# Patient Record
Sex: Female | Born: 1975 | Race: Black or African American | Hispanic: No | Marital: Single | Smoking: Former smoker
Health system: Southern US, Community
[De-identification: ages and names within clinical notes are randomized; demographics above are authoritative.]

---

## 1999-02-12 ENCOUNTER — Other Ambulatory Visit: Admission: RE | Admit: 1999-02-12 | Discharge: 1999-02-12 | Payer: Self-pay | Admitting: Obstetrics and Gynecology

## 1999-09-14 ENCOUNTER — Inpatient Hospital Stay (HOSPITAL_COMMUNITY): Admission: AD | Admit: 1999-09-14 | Discharge: 1999-09-21 | Payer: Self-pay | Admitting: Obstetrics and Gynecology

## 1999-10-15 ENCOUNTER — Other Ambulatory Visit: Admission: RE | Admit: 1999-10-15 | Discharge: 1999-10-15 | Payer: Self-pay | Admitting: Obstetrics and Gynecology

## 2001-02-02 ENCOUNTER — Other Ambulatory Visit: Admission: RE | Admit: 2001-02-02 | Discharge: 2001-02-02 | Payer: Self-pay | Admitting: Obstetrics and Gynecology

## 2002-03-02 ENCOUNTER — Other Ambulatory Visit: Admission: RE | Admit: 2002-03-02 | Discharge: 2002-03-02 | Payer: Self-pay | Admitting: Obstetrics and Gynecology

## 2002-10-04 ENCOUNTER — Other Ambulatory Visit: Admission: RE | Admit: 2002-10-04 | Discharge: 2002-10-04 | Payer: Self-pay | Admitting: Obstetrics and Gynecology

## 2003-05-11 ENCOUNTER — Other Ambulatory Visit: Admission: RE | Admit: 2003-05-11 | Discharge: 2003-05-11 | Payer: Self-pay | Admitting: Obstetrics and Gynecology

## 2003-05-14 ENCOUNTER — Other Ambulatory Visit: Admission: RE | Admit: 2003-05-14 | Discharge: 2003-05-14 | Payer: Self-pay | Admitting: Obstetrics and Gynecology

## 2004-05-22 ENCOUNTER — Other Ambulatory Visit: Admission: RE | Admit: 2004-05-22 | Discharge: 2004-05-22 | Payer: Self-pay | Admitting: Obstetrics and Gynecology

## 2005-05-28 ENCOUNTER — Ambulatory Visit: Payer: Self-pay | Admitting: Professional

## 2005-06-11 ENCOUNTER — Ambulatory Visit: Payer: Self-pay | Admitting: Professional

## 2005-06-18 ENCOUNTER — Ambulatory Visit: Payer: Self-pay | Admitting: Professional

## 2013-01-26 ENCOUNTER — Other Ambulatory Visit: Payer: Self-pay | Admitting: Family Medicine

## 2013-01-26 DIAGNOSIS — G43909 Migraine, unspecified, not intractable, without status migrainosus: Secondary | ICD-10-CM

## 2013-01-30 ENCOUNTER — Other Ambulatory Visit: Payer: Self-pay

## 2013-02-06 ENCOUNTER — Other Ambulatory Visit: Payer: Self-pay

## 2014-04-02 ENCOUNTER — Encounter: Payer: Self-pay | Admitting: Podiatry

## 2014-04-02 ENCOUNTER — Ambulatory Visit (INDEPENDENT_AMBULATORY_CARE_PROVIDER_SITE_OTHER): Payer: BC Managed Care – PPO

## 2014-04-02 ENCOUNTER — Ambulatory Visit (INDEPENDENT_AMBULATORY_CARE_PROVIDER_SITE_OTHER): Payer: BC Managed Care – PPO | Admitting: Podiatry

## 2014-04-02 VITALS — BP 142/85 | HR 69 | Resp 15 | Ht 65.0 in | Wt 195.0 lb

## 2014-04-02 DIAGNOSIS — M7751 Other enthesopathy of right foot: Secondary | ICD-10-CM

## 2014-04-02 DIAGNOSIS — M898X9 Other specified disorders of bone, unspecified site: Secondary | ICD-10-CM

## 2014-04-02 DIAGNOSIS — M2020 Hallux rigidus, unspecified foot: Secondary | ICD-10-CM

## 2014-04-02 NOTE — Progress Notes (Signed)
   Subjective:    Patient ID: Mckenzie FellingKimberly E Koslow, female    DOB: 03/11/1976, 38 y.o.   MRN: [id number]  HPI Comments: N bone spur L B/L 1st MPJ D and O 2 months  C 1st MPJs are enlarged, and painful A enclosed shoes T none  Foot Pain   This patient is complaining of pain in the right great toe joint more than the left when she attempts to wear a closed shoe, even in athletic style shoe. The pain is localized more on the dorsal aspect of the first metatarsal area. She wears soft or open shoes as much is possible. She would like to be more physically active in the foot pain is preventing her from increasing her physical activity.   Review of Systems  All other systems reviewed and are negative.      Objective:   Physical Exam Orientated [phone] 38 year old black female  Vascular DP and PT pulses 2/4 bilaterally.  Neurological: Ankle reflexes reactive bilaterally  Dermatological: There is a dorsal prominence with low-grade edema localized over the head of the first metatarsal areas, right more than left.  Musculoskeletal: There is restriction in range of motion of the first metatarsal phalangeal joint with the foot in a neutral in weightbearing position bilaterally. Dorsi flexion is approximately 20 bilaterally. There is palpable tenderness in the dorsal first metatarsal head, bilaterally.  The first rays are hypermobile, bilaterally  X-ray report weightbearing right foot foot   Intact bony structure without fracture and/or dislocation noted. The first metatarsal is a relatively long as compared to the second left metatarsal. There is a small dorsal spur on the head of the first metatarsal. The first ray is elevated.  Radiographic impression:  No acute bony abnormality noted  Long first metatarsal with metatarsus primus elavatus and beginning OsteoArthritic changes  X-ray report weightbearing left foot   Intact bony structure without fracture and/or dislocation noted. The first  metatarsal is a relatively long as compared to the second left metatarsal. There is a small dorsal spur on the head of the first metatarsal. The first ray is elevated.  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      Assessment & Plan:   Assessment: Hallux limitus/rigidus bilaterally  Hypermobile first rays, bilaterally  Plan: Had a detailed discussion was patient about treatment options, including shoe modification, foot supports and reduction of activity. In general I made patient aware that her condition tended to be progressive in over a multiple year period of time, the first metatarsophalangeal joint would become progressively more more painful as a result of osteoarthritis. I made her aware that surgical options were available. Patient has interest in possible surgical options.  Reschedule patient for further evaluation with Dr. Arbutus Pedodd Hyatt.

## 2014-04-02 NOTE — Patient Instructions (Signed)
Hallux Rigidus Hallux rigidus is a condition involving pain and a loss of motion of the first (big) toe. The pain gets worse with lifting up (extension) of the toe. This is usually due to arthritic bony bumps (spurring) of the joint at the base of the big toe.  SYMPTOMS   Pain, with lifting up of the toe.  Tenderness over the joint where the big toe meets the foot.  Redness, swelling, and warmth over the top of the base of the big toe (sometimes).  Foot pain, stiffness, and limping. CAUSES  Halllux rigidus is caused by arthritis of the joint where the big toe meets the foot. The arthritis creates a bone spur that pinches the soft tissues, when the toe is extended. RISK INCREASES WITH:  Tight shoes, with a narrow toe box.  Family history of foot problems.  Gout and rheumatoid and psoriatic arthritis.  History of previous toe injury, including "turf toe."  Long first toe, flat feet, and other big toe bony bumps.  Arthritis of the big toe. PREVENTION   Wear wide toed shoes that fit well.  Tape the big toe, to reduce motion and to prevent pinching of the tissues between the bone.  Maintain physical fitness:  Foot and ankle flexibility.  Muscle strength and endurance. PROGNOSIS  This condition can usually be managed with proper treatment. However, surgery is typically required to prevent the problem from recurring.  RELATED COMPLICATIONS  Injury to other areas of the foot or ankle, caused by abnormal walking in an attempt to avoid the pain felt when walking normally. TREATMENT Treatment first involves stopping the activities that aggravate your symptoms. Ice and medicine can be used to reduce the pain and inflammation. Modifications to shoes may help reduce pain, including wearing stiff-soled shoes, shoes with a wide toe box, inserting a padded donut to relieve pressure on top of the joint, or wearing an arch support. Corticosteroid injections may be given to reduce inflammation.  If non-surgical treatment is unsuccessful, surgery may be needed. Surgical options include removing the arthritic bony spur, cutting a bone in the foot to change the arc of motion (allowing the toe to extend more), or fusion of the joint (eliminating all motion in the joint at the base of the big toe).  MEDICATION   If pain medicine is needed, nonsteroidal anti-inflammatory medicines (aspirin and ibuprofen), or other minor pain relievers (acetaminophen), are often advised.  Do not take pain medicine for 7 days before surgery.  Prescription pain relievers are usually prescribed only after surgery. Use only as directed and only as much as you need.  Ointments for arthritis, applied to the skin, may give some relief.  Injections of corticosteroids may be given to reduce inflammation. HEAT AND COLD  Cold treatment (icing) relieves pain and reduces inflammation. Cold treatment should be applied for 10 to 15 minutes every 2 to 3 hours, and immediately after activity that aggravates your symptoms. Use ice packs or an ice massage.  Heat treatment may be used before performing the stretching and strengthening activities prescribed by your caregiver, physical therapist, or athletic trainer. Use a heat pack or a warm water soak. SEEK MEDICAL CARE IF:   Symptoms get worse or do not improve in 2 weeks, despite treatment.  After surgery you develop fever, increasing pain, redness, swelling, drainage of fluids, bleeding, or increasing warmth.  New, unexplained symptoms develop. (Drugs used in treatment may produce side effects.) Document Released: 12/07/2005 Document Revised: 02/29/2012 Document Reviewed: 03/21/2009 [employer] Patient   Information 2014 [employer].  

## 2014-04-03 ENCOUNTER — Encounter: Payer: Self-pay | Admitting: Podiatry

## 2014-05-01 ENCOUNTER — Encounter: Payer: Self-pay | Admitting: Podiatry

## 2014-05-01 ENCOUNTER — Ambulatory Visit (INDEPENDENT_AMBULATORY_CARE_PROVIDER_SITE_OTHER): Payer: BC Managed Care – PPO | Admitting: Podiatry

## 2014-05-01 ENCOUNTER — Ambulatory Visit: Payer: BC Managed Care – PPO | Admitting: Podiatry

## 2014-05-01 VITALS — BP 132/81 | HR 80 | Resp 12

## 2014-05-01 DIAGNOSIS — M2020 Hallux rigidus, unspecified foot: Secondary | ICD-10-CM

## 2014-05-01 NOTE — Progress Notes (Signed)
Mckenzie BradfordKimberly presents today after being referred to me by Dr. Leeanne Deeduchman for hallux limitus right greater than left. She would like to have surgical intervention but is questioning going to have it.  Objective: Vital signs are stable she is alert and oriented x3. I reviewed her past medical history medications allergies surgeries social history and notes from Dr. Leeanne Deeduchman as well as her radiographs. Pulses remain palpable bilateral. She has limited range of motion of approximately 20 of dorsiflexion the first metatarsophalangeal joint of the right foot and similarly left. 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 She does not have gastroc equinus.  Assessment: Elongated first metatarsal elevated first metatarsal right foot dorsal spurring joint space narrowing; hallux limitus right greater than left.  Plan: Discussed etiology pathology conservative versus surgical therapies. We did discuss the she does chevron osteotomy and a double chevron osteotomy and junction with a cotton procedure. She would like to have the better correction and understands this and take 8 weeks of cast nonweightbearing fashion and she's not ready for this at this time. She would like to wait until December before this is done. She will followup with me in November to discuss this once again.

## 2014-08-24 ENCOUNTER — Other Ambulatory Visit (HOSPITAL_COMMUNITY)
Admission: RE | Admit: 2014-08-24 | Discharge: 2014-08-24 | Disposition: A | Discharge status: Home / Self Care | Payer: BC Managed Care – PPO | Source: Ambulatory Visit | Attending: Obstetrics & Gynecology | Admitting: Obstetrics & Gynecology

## 2014-08-24 ENCOUNTER — Other Ambulatory Visit: Payer: Self-pay | Admitting: Obstetrics & Gynecology

## 2014-08-24 DIAGNOSIS — Z01419 Encounter for gynecological examination (general) (routine) without abnormal findings: Secondary | ICD-10-CM | POA: Diagnosis present

## 2014-08-24 DIAGNOSIS — Z1151 Encounter for screening for human papillomavirus (HPV): Secondary | ICD-10-CM | POA: Diagnosis present

## 2014-08-29 LAB — CYTOLOGY - PAP

## 2014-10-24 ENCOUNTER — Other Ambulatory Visit: Payer: Self-pay | Admitting: Family Medicine

## 2014-10-24 DIAGNOSIS — E049 Nontoxic goiter, unspecified: Secondary | ICD-10-CM

## 2014-11-06 ENCOUNTER — Other Ambulatory Visit: Payer: BC Managed Care – PPO

## 2014-11-06 ENCOUNTER — Ambulatory Visit: Payer: BC Managed Care – PPO | Admitting: Podiatry

## 2014-11-12 ENCOUNTER — Other Ambulatory Visit: Payer: BC Managed Care – PPO

## 2014-11-20 ENCOUNTER — Ambulatory Visit (INDEPENDENT_AMBULATORY_CARE_PROVIDER_SITE_OTHER): Payer: BC Managed Care – PPO | Admitting: Podiatry

## 2014-11-20 VITALS — BP 139/87 | HR 66 | Resp 16

## 2014-11-20 DIAGNOSIS — M2021 Hallux rigidus, right foot: Secondary | ICD-10-CM

## 2014-11-20 NOTE — Progress Notes (Signed)
She presents today for surgical consult regarding her right foot. She states that it is beginning to affect her ability to perform her daily activities and to perform physical exercise to keep her health in check.  Objective: Vital signs are stable she is alert and oriented 3. Pulses are intact. Elevated first metatarsal with hallux limitus first metatarsophalangeal joint. Radiographs were reviewed and confirmed today.  Assessment: Has planus with an elevated first metatarsal resulting in hallux limitus first metatarsophalangeal joint right foot.  Plan: Discussed etiology and pathology conservative versus surgical therapies. We will perform a cotton osteotomy with an Eliberto IvoryAustin Elswick osteotomy and screw. I answered all the questions regarding this procedure to the best of my ability in layman's terms. She understood this and was amenable to it. She also understands that she will receive a below-knee cast. We did discuss the possible postop complications which may include are not limited to postop pain bleeding swelling infection recurrence need further surgery. I will follow-up with her the near future for surgical correction.

## 2014-11-20 NOTE — Patient Instructions (Signed)
Pre-Operative Instructions  Congratulations, you have decided to take an important step to improving your quality of life.  You can be assured that the doctors of [facility] will be with you every step of the way.  1. Plan to be at the surgery center/hospital at least 1 (one) hour prior to your scheduled time unless otherwise directed by the surgical center/hospital staff.  You must have a responsible adult accompany you, remain during the surgery and drive you home.  Make sure you have directions to the surgical center/hospital and know how to get there on time. 2. For hospital based surgery you will need to obtain a history and physical form from your family physician within 1 month prior to the date of surgery- we will give you a form for you primary physician.  3. We make every effort to accommodate the date you request for surgery.  There are however, times where surgery dates or times have to be moved.  We will contact you as soon as possible if a change in schedule is required.   4. No Aspirin/Ibuprofen for one week before surgery.  If you are on aspirin, any non-steroidal anti-inflammatory medications (Mobic, Aleve, Ibuprofen) you should stop taking it 7 days prior to your surgery.  You make take Tylenol  For pain prior to surgery.  5. Medications- If you are taking daily heart and blood pressure medications, seizure, reflux, allergy, asthma, anxiety, pain or diabetes medications, make sure the surgery center/hospital is aware before the day of surgery so they may notify you which medications to take or avoid the day of surgery. 6. No food or drink after midnight the night before surgery unless directed otherwise by surgical center/hospital staff. 7. No alcoholic beverages 24 hours prior to surgery.  No smoking 24 hours prior to or 24 hours after surgery. 8. Wear loose pants or shorts- loose enough to fit over bandages, boots, and casts. 9. No slip on shoes, sneakers are best. 10. Bring  your boot with you to the surgery center/hospital.  Also bring crutches or a walker if your physician has prescribed it for you.  If you do not have this equipment, it will be provided for you after surgery. 11. If you have not been contracted by the surgery center/hospital by the day before your surgery, call to confirm the date and time of your surgery. 12. Leave-time from work may vary depending on the type of surgery you have.  Appropriate arrangements should be made prior to surgery with your employer. 13. Prescriptions will be provided immediately following surgery by your doctor.  Have these filled as soon as possible after surgery and take the medication as directed. 14. Remove nail polish on the operative foot. 15. Wash the night before surgery.  The night before surgery wash the foot and leg well with the antibacterial soap provided and water paying special attention to beneath the toenails and in between the toes.  Rinse thoroughly with water and dry well with a towel.  Perform this wash unless told not to do so by your physician.  Enclosed: 1 Ice pack (please put in freezer the night before surgery)   1 Hibiclens skin cleaner   Pre-op Instructions  If you have any questions regarding the instructions, do not hesitate to call our office.  [place]: [address] [place], [place] [address] [phone]  [place]: [address] [place], [place] [address] [phone]  [place]: [address]  [place], [place] [address] [phone]  Dr. Richard   Tuchman DPM, Dr. Norman Regal DPM Dr. Richard Sikora DPM, Dr. M. Todd Hyatt DPM, Dr. Kathryn Egerton DPM 

## 2014-11-21 ENCOUNTER — Telehealth: Payer: Self-pay | Admitting: *Deleted

## 2014-11-21 NOTE — Telephone Encounter (Signed)
Pt request copy of the surgical consent form she signed yesterday and had questions about the procedure.  I spoke with pt, she requested the consent be faxed to [phone], then she asked how long she would be in the boot after the cast was removed.  I told her about 4 - 6 weeks and faxed the forms.

## 2014-12-03 ENCOUNTER — Ambulatory Visit
Admission: RE | Admit: 2014-12-03 | Discharge: 2014-12-03 | Disposition: A | Discharge status: Home / Self Care | Payer: BC Managed Care – PPO | Source: Ambulatory Visit | Attending: Family Medicine | Admitting: Family Medicine

## 2014-12-03 DIAGNOSIS — E049 Nontoxic goiter, unspecified: Secondary | ICD-10-CM

## 2014-12-19 ENCOUNTER — Other Ambulatory Visit: Payer: Self-pay | Admitting: Podiatry

## 2014-12-19 ENCOUNTER — Encounter: Payer: Self-pay | Admitting: Podiatry

## 2014-12-19 DIAGNOSIS — M2011 Hallux valgus (acquired), right foot: Secondary | ICD-10-CM

## 2014-12-19 DIAGNOSIS — M2021 Hallux rigidus, right foot: Secondary | ICD-10-CM

## 2014-12-19 MED ORDER — CEPHALEXIN 500 MG PO CAPS: 500.0000 mg | ORAL_CAPSULE | Freq: Three times a day (TID) | ORAL | Status: DC

## 2014-12-19 MED ORDER — PROMETHAZINE HCL 25 MG PO TABS: 25.0000 mg | ORAL_TABLET | Freq: Three times a day (TID) | ORAL | Status: DC | PRN

## 2014-12-19 MED ORDER — OXYCODONE-ACETAMINOPHEN 10-325 MG PO TABS: ORAL_TABLET | ORAL | Status: AC

## 2014-12-24 ENCOUNTER — Telehealth: Payer: Self-pay | Admitting: *Deleted

## 2014-12-24 NOTE — Telephone Encounter (Addendum)
Pt states she has itching only when she takes 2 Percocet she itches and would like to know if she could take Benadryl with.  Dr. Ardelle Anton states take the Benadryl and Phenergan when taking the Percocet, and if problems with swelling or difficulty breathing or swallowing go to the ER.  Pt was instructed the medication could be changed, but opted to continue.

## 2014-12-24 NOTE — Telephone Encounter (Signed)
-----   Message from Darreld Mclean sent at 12/24/2014  4:08 PM EST ----- Regarding: Questions about pain Medication Contact: [phone] Patient is coming in Thursday for her first post-op appointment and has questions about the pain medication. Can someone please call her back today. Thanks.

## 2014-12-24 NOTE — Telephone Encounter (Signed)
I called the patient.  "One of the nurses has called me back.  Thanks for calling me back."

## 2014-12-25 ENCOUNTER — Encounter: Payer: BC Managed Care – PPO | Admitting: Podiatry

## 2014-12-26 NOTE — Progress Notes (Unsigned)
Dr Al CorpusHyatt performed a right Mckenzie Davis bunionectomy and right tarsal osteotomy with cast application

## 2014-12-27 ENCOUNTER — Ambulatory Visit (INDEPENDENT_AMBULATORY_CARE_PROVIDER_SITE_OTHER): Payer: BLUE CROSS/BLUE SHIELD | Admitting: Podiatry

## 2014-12-27 ENCOUNTER — Ambulatory Visit (INDEPENDENT_AMBULATORY_CARE_PROVIDER_SITE_OTHER): Payer: BLUE CROSS/BLUE SHIELD

## 2014-12-27 ENCOUNTER — Encounter: Payer: Self-pay | Admitting: Podiatry

## 2014-12-27 VITALS — BP 131/82 | HR 84 | Resp 13

## 2014-12-27 DIAGNOSIS — M2011 Hallux valgus (acquired), right foot: Secondary | ICD-10-CM

## 2014-12-28 NOTE — Progress Notes (Signed)
Subjective:     Patient ID: Mckenzie Davis, female   DOB: 02/23/1976, 39 y.o.   MRN: [id number]  HPI patient states I have had minimal discomfort I have not ambulated on my foot and have been using crutches and my rollabout and I have had minimal problems with swelling   Review of Systems     Objective:   Physical Exam Neurovascular to the digits is intact with cast intact in the right lower leg    Assessment:     Doing well post Eliberto IvoryAustin and cotton osteotomy right    Plan:     Reviewed x-rays and advised that the patient will be seen back in 2 weeks for bivalving removal of the cast with consideration for either new cast or Cam Walker and to call us immediately if any problems should occur

## 2014-12-31 ENCOUNTER — Telehealth: Payer: Self-pay | Admitting: *Deleted

## 2014-12-31 NOTE — Telephone Encounter (Signed)
"  I called and informed her I think it is normal but I will check with Dr. Al CorpusHyatt.  "I just want to make sure."

## 2014-12-31 NOTE — Telephone Encounter (Signed)
Yes you are correct.  This can be normal and should subside as the swelling decreases.

## 2014-12-31 NOTE — Telephone Encounter (Signed)
"  I just have some questions in reference to the way my toes are feeling after the surgery I had on the 30th.  I have tingly, numb feeling, feel like some pain spasms are going from the heel up to the top of my foot.  I just want to know if this is normal?"

## 2015-01-08 ENCOUNTER — Ambulatory Visit (INDEPENDENT_AMBULATORY_CARE_PROVIDER_SITE_OTHER): Payer: BLUE CROSS/BLUE SHIELD | Admitting: Podiatry

## 2015-01-08 VITALS — BP 121/71 | HR 79 | Temp 99.2°F | Resp 16

## 2015-01-08 DIAGNOSIS — Z9889 Other specified postprocedural states: Secondary | ICD-10-CM

## 2015-01-08 NOTE — Progress Notes (Signed)
She presents today for cast removal status post cotton osteotomy and Austin insert osteotomy right foot. She denies fever chills nausea vomiting muscle aches and pains other than some spasms in her right foot and leg.  Objective: Vital signs are stable she is alert and oriented 3. Cast was removed today. Once removed demonstrates minimal edema no erythema cellulitis drainage or odor. Incision lines appear to be intact and coapted well she has a nice arch in the foot and a stiff first metatarsophalangeal joint. She has little range of motion and states that she did not think she was supposed to move it. She also states that it hurts to move it.  Assessment: Well-healing surgical foot right.  Plan: At this point I will place her in a compression anklet and allow her to wash this foot. She is to continue nonweightbearing stance and I'm going to place her in a Cam Walker. She will continue crutches but this will encourage motion about the toe regularly. I will follow-up with her in 2 weeks at which time if the motion is not better we will consider physical therapy.

## 2015-01-22 ENCOUNTER — Ambulatory Visit (INDEPENDENT_AMBULATORY_CARE_PROVIDER_SITE_OTHER): Payer: BLUE CROSS/BLUE SHIELD

## 2015-01-22 ENCOUNTER — Ambulatory Visit (INDEPENDENT_AMBULATORY_CARE_PROVIDER_SITE_OTHER): Payer: BLUE CROSS/BLUE SHIELD | Admitting: Podiatry

## 2015-01-22 VITALS — BP 112/64 | HR 98 | Resp 16

## 2015-01-22 DIAGNOSIS — M21611 Bunion of right foot: Secondary | ICD-10-CM

## 2015-01-22 DIAGNOSIS — Z9889 Other specified postprocedural states: Secondary | ICD-10-CM

## 2015-01-22 DIAGNOSIS — M2011 Hallux valgus (acquired), right foot: Secondary | ICD-10-CM

## 2015-01-22 NOTE — Progress Notes (Signed)
She presents today for a follow-up of her cotton osteotomy and her Jerrol Bananaustin Youngs with osteotomy. She still presents utilizing her crutches and her Cam Walker. Nonweightbearing fashion. She denies fever chills nausea vomiting muscle aches and pains.  Objective: Vital signs are stable she is alert and oriented 3 right foot demonstrates a well-healing surgical foot and appears to be perfectly rectus and in good position. She has been plantar flexion but is limited on dorsiflexion because of stiffness. She states that she did not know that she can move the toe without hurting something. Radiographic evaluation confirms a cotton osteotomy that appears to be incorporating a well-healed Jerrol BananaAustin Youngs with osteotomy with screw fixation. Physical exam also demonstrate mild edema with limitation on dorsiflexion at the metatarsophalangeal joint.  Assessment: At this point I'm encouraging her to perform range of motion exercises first metatarsophalangeal joint which demonstrated both her and her mother today I also am encouraging her to walk utilizing her Cam Walker and not utilizing the crutches.  Plan: I will follow-up with her in 2 weeks at which time if she is not better and does not have a increased range of motion physical therapy will then be necessary.

## 2015-02-05 ENCOUNTER — Encounter: Payer: BLUE CROSS/BLUE SHIELD | Admitting: Podiatry

## 2015-02-07 ENCOUNTER — Encounter: Payer: Self-pay | Admitting: Podiatry

## 2015-02-07 ENCOUNTER — Ambulatory Visit (INDEPENDENT_AMBULATORY_CARE_PROVIDER_SITE_OTHER): Payer: BLUE CROSS/BLUE SHIELD

## 2015-02-07 ENCOUNTER — Ambulatory Visit (INDEPENDENT_AMBULATORY_CARE_PROVIDER_SITE_OTHER): Payer: BLUE CROSS/BLUE SHIELD | Admitting: Podiatry

## 2015-02-07 DIAGNOSIS — Z9889 Other specified postprocedural states: Secondary | ICD-10-CM

## 2015-02-07 DIAGNOSIS — M21611 Bunion of right foot: Secondary | ICD-10-CM

## 2015-02-07 DIAGNOSIS — M2011 Hallux valgus (acquired), right foot: Secondary | ICD-10-CM

## 2015-02-07 NOTE — Progress Notes (Signed)
She presents today approximately 6 weeks status post cotton osteotomy and Vassie MoselleAustin Young Zwick osteotomy with screw right foot. She states this seems to be doing better daily.  Objective: Vital signs are stable she is alert and oriented 3. Pulses are palpable. She has very good range of motion plantarly however she has tenderness on dorsiflexion. We only have approximately 20-25 of dorsiflexion at this point in time.  Assessment well-healing surgical foot right.  Plan: Increase range of motion exercises and she will try to get back into a regular shoe at this point I will follow-up with her in 2 weeks and recheck our range of motion if not improved we will send to physical therapy.

## 2015-02-21 ENCOUNTER — Ambulatory Visit (INDEPENDENT_AMBULATORY_CARE_PROVIDER_SITE_OTHER): Payer: BLUE CROSS/BLUE SHIELD | Admitting: Podiatry

## 2015-02-21 ENCOUNTER — Ambulatory Visit (INDEPENDENT_AMBULATORY_CARE_PROVIDER_SITE_OTHER): Payer: BLUE CROSS/BLUE SHIELD

## 2015-02-21 ENCOUNTER — Encounter: Payer: Self-pay | Admitting: Podiatry

## 2015-02-21 VITALS — BP 113/76 | HR 92 | Resp 16

## 2015-02-21 DIAGNOSIS — M2011 Hallux valgus (acquired), right foot: Secondary | ICD-10-CM

## 2015-02-21 DIAGNOSIS — Z9889 Other specified postprocedural states: Secondary | ICD-10-CM

## 2015-02-21 NOTE — Progress Notes (Signed)
She presents today 8 weeks status post cotton osteotomy and osteotomy answered osteotomy first metatarsophalangeal joint right foot. She states it seems to be doing much better.  Objective: Vital signs are stable she is alert and oriented 3. Pulses are strongly palpable. She has much better range of motion of the first metatarsophalangeal joint and previously noted. Radiographs confirm well-healing surgical foot.  Assessment: Well-healing surgical foot status post cotton osteotomy and osteotomy answered osteotomy 8-9 weeks.  Plan: I encouraged range of motion exercises and we are going to request assistance from physical therapy. I will follow up with her once physical therapy has completed her treatment regimen.

## 2015-03-05 ENCOUNTER — Encounter: Payer: Self-pay | Admitting: Podiatry

## 2015-03-05 ENCOUNTER — Telehealth: Payer: Self-pay

## 2015-03-05 NOTE — Telephone Encounter (Signed)
Left message for pt regarding return to work. Dr Al CorpusHyatt oked her to return on 3.23.16 and to continue with PT

## 2015-03-20 DIAGNOSIS — R52 Pain, unspecified: Secondary | ICD-10-CM

## 2015-06-09 IMAGING — US US SOFT TISSUE HEAD/NECK
1 series · 14 of 25 positions shown · non-contrast
Comparison: None.

CLINICAL DATA: Goiter

EXAM:
THYROID ULTRASOUND
TECHNIQUE: Ultrasound examination of the thyroid gland and adjacent soft
tissues was performed.

[Series 1: us soft tissue head/neck · 0.07mm/px · 14 of 36 slices shown]
[im 1/36]
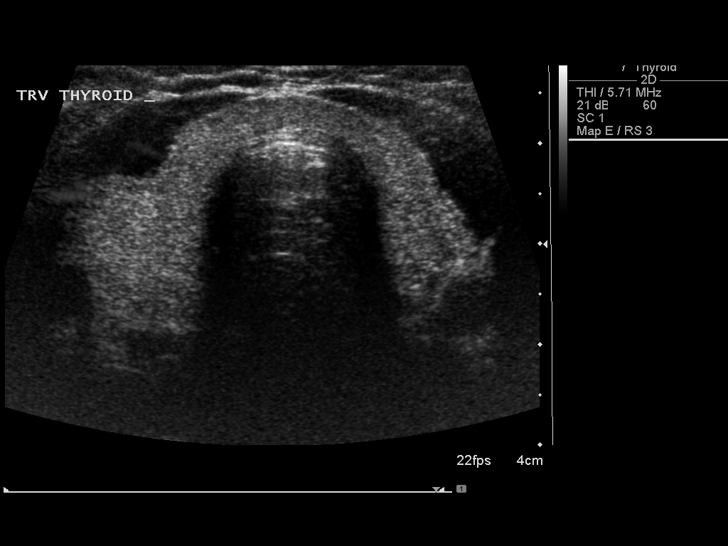
[im 3/36]
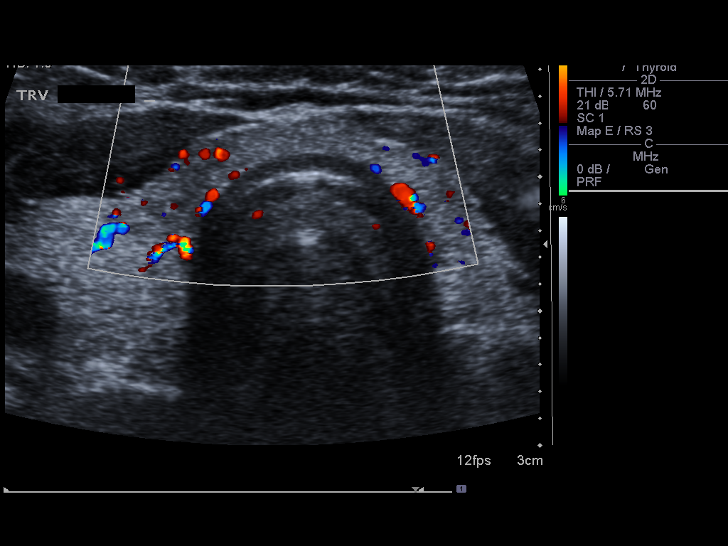
[im 6/36]
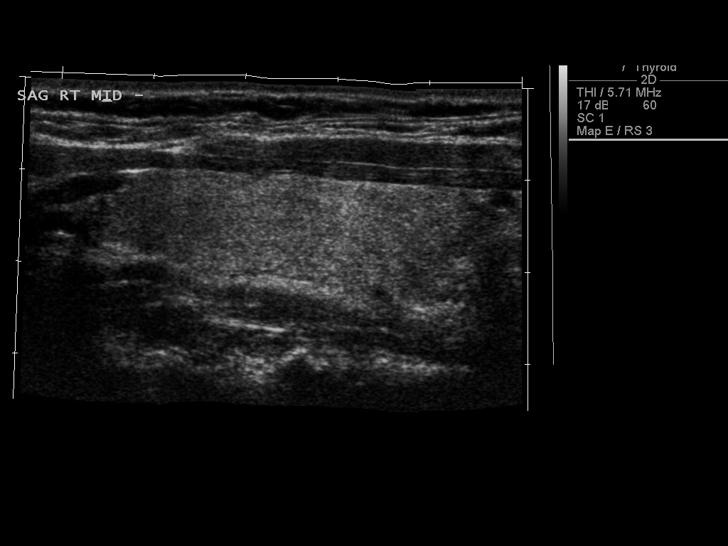
[im 9/36]
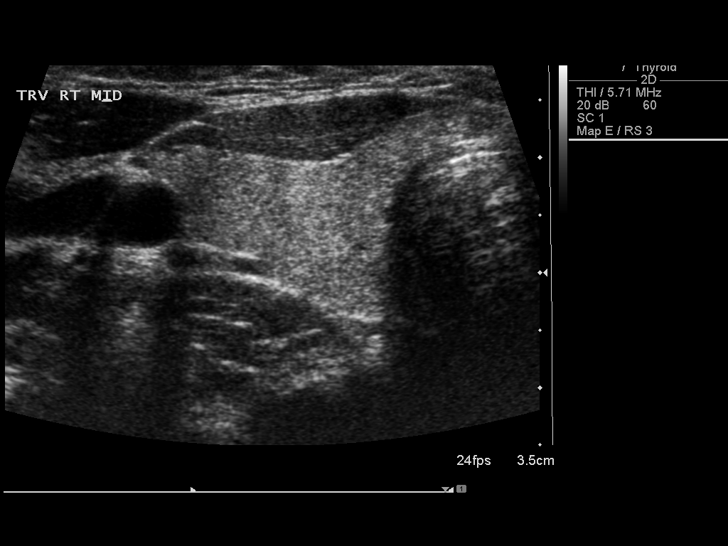
[im 12/36]
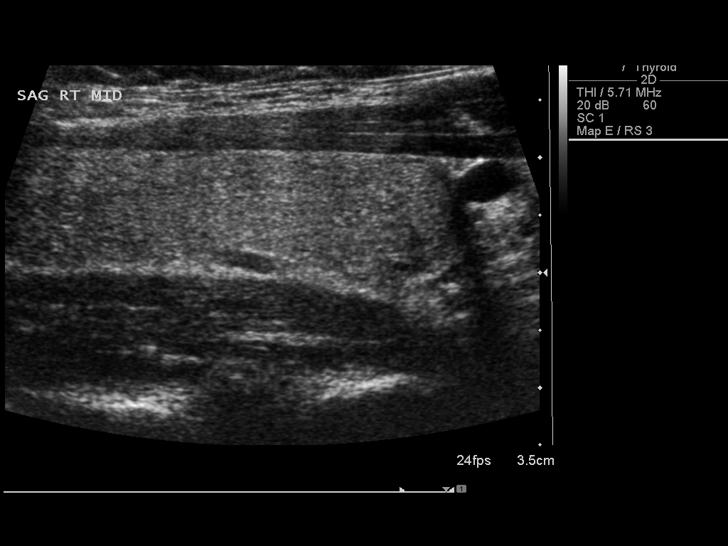
[im 14/36]
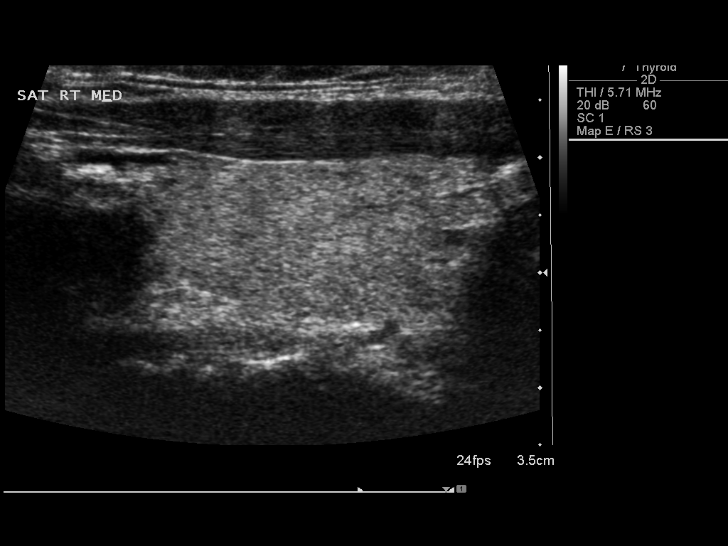
[im 17/36]
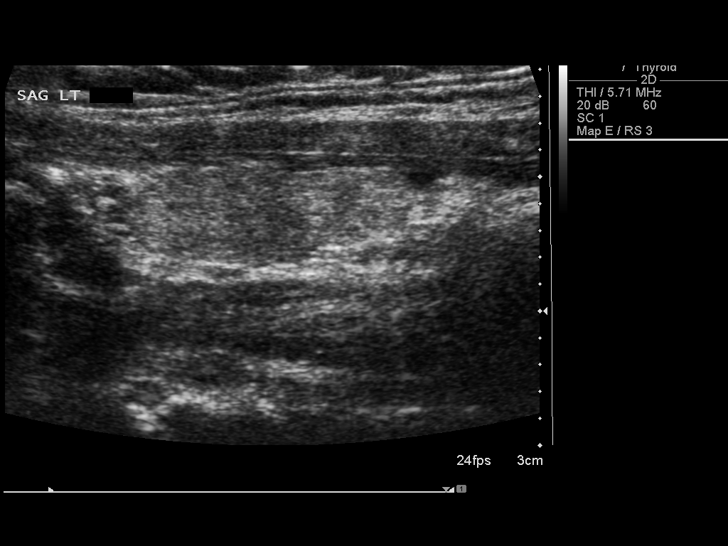
[im 19/36]
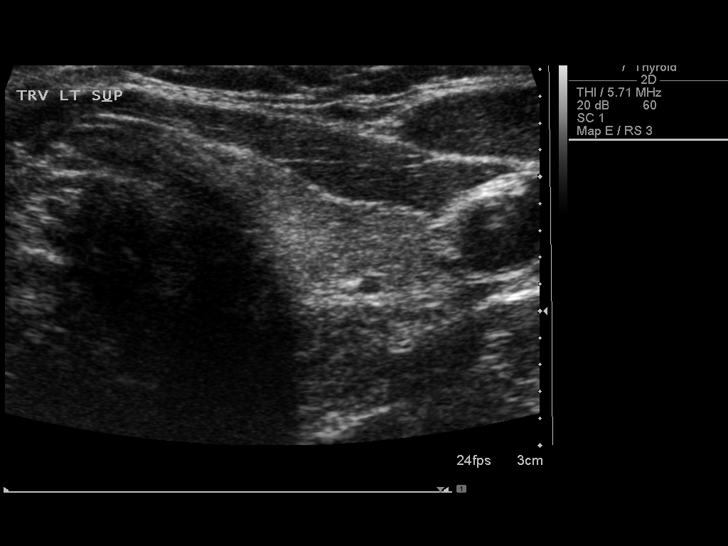
[im 22/36]
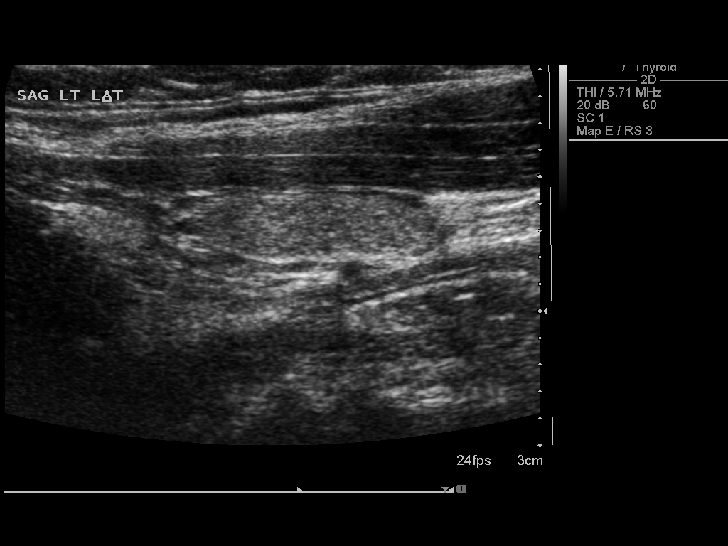
[im 24/36]
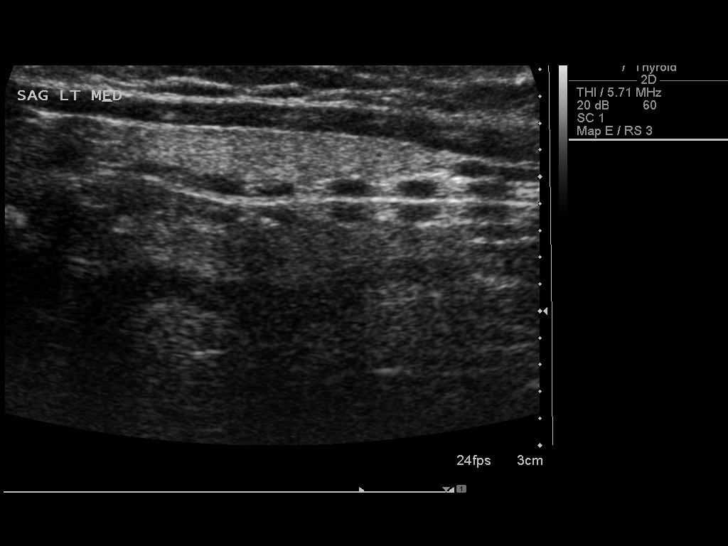
[im 27/36]
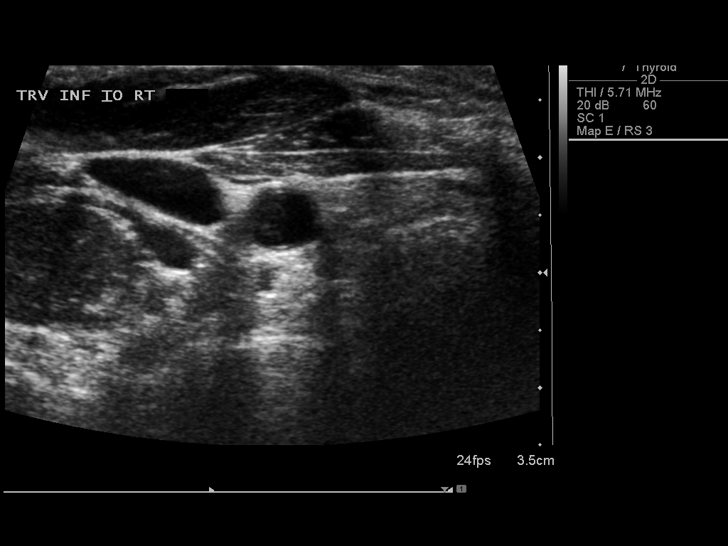
[im 30/36]
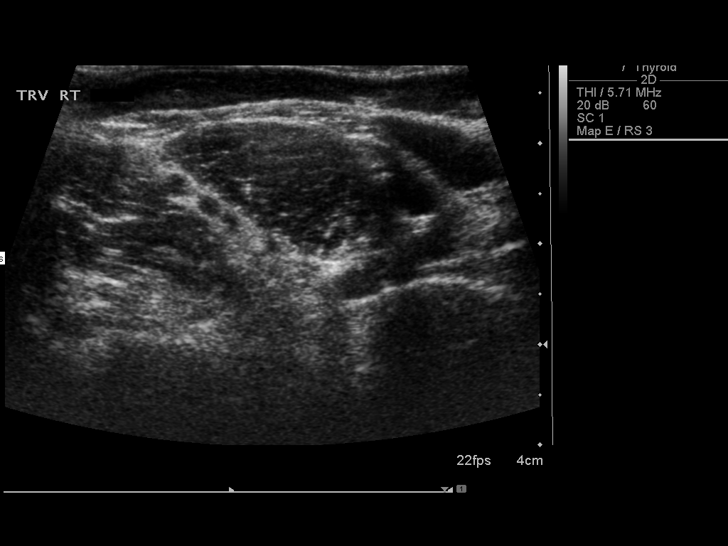
[im 33/36]
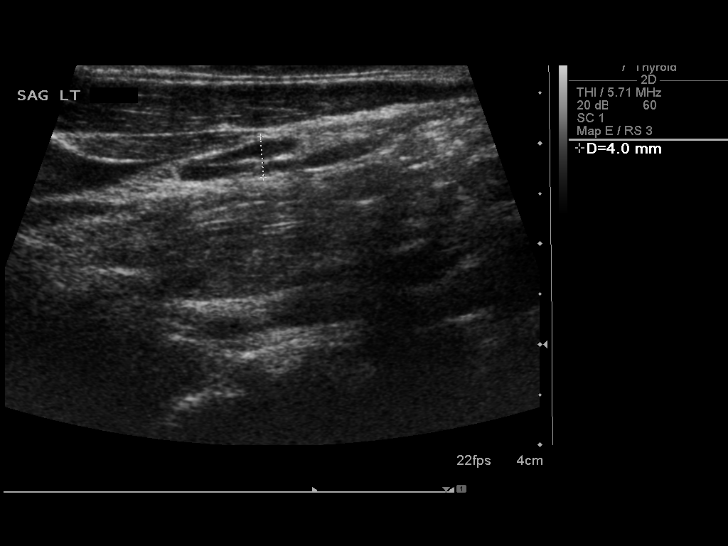
[im 36/36]
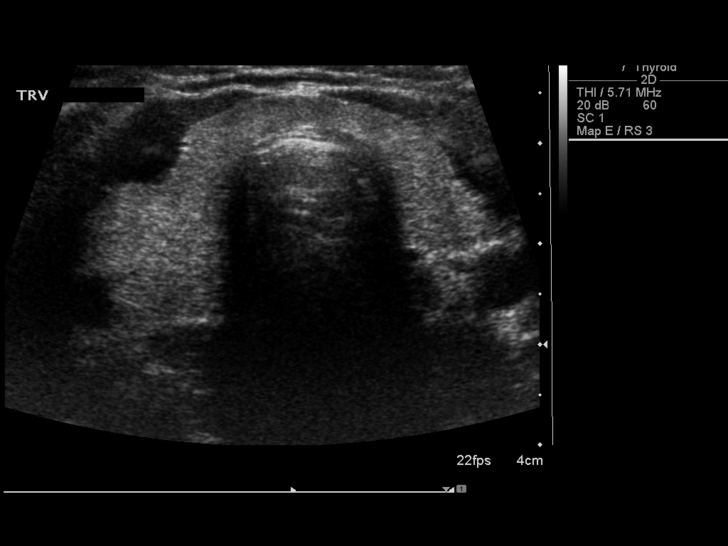

[14 of 25 positions shown; findings below may reference images not displayed]

FINDINGS: Right thyroid lobe

Measurements: 4.6 x 1.3 x 2.0 cm.  No nodules visualized.

Left thyroid lobe

Measurements: 2.5 x 0.8 x 1.3 cm.  No nodules visualized.

Isthmus

Thickness: 3 mm.  No nodules visualized.

Lymphadenopathy

None visualized.
IMPRESSION: Within normal limits.  No evidence of nodule.

## 2015-08-19 ENCOUNTER — Encounter: Payer: Self-pay | Admitting: *Deleted

## 2017-01-22 ENCOUNTER — Other Ambulatory Visit (HOSPITAL_COMMUNITY)
Admission: RE | Admit: 2017-01-22 | Discharge: 2017-01-22 | Disposition: A | Discharge status: Home / Self Care | Payer: BLUE CROSS/BLUE SHIELD | Source: Ambulatory Visit | Attending: Obstetrics & Gynecology | Admitting: Obstetrics & Gynecology

## 2017-01-22 ENCOUNTER — Other Ambulatory Visit: Payer: Self-pay | Admitting: Obstetrics & Gynecology

## 2017-01-22 DIAGNOSIS — Z1151 Encounter for screening for human papillomavirus (HPV): Secondary | ICD-10-CM | POA: Insufficient documentation

## 2017-01-22 DIAGNOSIS — Z01411 Encounter for gynecological examination (general) (routine) with abnormal findings: Secondary | ICD-10-CM | POA: Diagnosis present

## 2017-01-27 LAB — CYTOLOGY - PAP
Adequacy: ABSENT
Diagnosis: NEGATIVE
HPV: NOT DETECTED

## 2017-07-22 ENCOUNTER — Other Ambulatory Visit: Payer: Self-pay | Admitting: Obstetrics & Gynecology

## 2018-01-26 DIAGNOSIS — Z01411 Encounter for gynecological examination (general) (routine) with abnormal findings: Secondary | ICD-10-CM | POA: Diagnosis not present

## 2018-01-26 DIAGNOSIS — R030 Elevated blood-pressure reading, without diagnosis of hypertension: Secondary | ICD-10-CM | POA: Diagnosis not present

## 2018-01-26 DIAGNOSIS — J209 Acute bronchitis, unspecified: Secondary | ICD-10-CM | POA: Diagnosis not present

## 2018-01-26 DIAGNOSIS — J069 Acute upper respiratory infection, unspecified: Secondary | ICD-10-CM | POA: Diagnosis not present

## 2018-01-26 DIAGNOSIS — N939 Abnormal uterine and vaginal bleeding, unspecified: Secondary | ICD-10-CM | POA: Diagnosis not present

## 2018-02-10 DIAGNOSIS — N939 Abnormal uterine and vaginal bleeding, unspecified: Secondary | ICD-10-CM | POA: Diagnosis not present

## 2018-02-10 DIAGNOSIS — Z3009 Encounter for other general counseling and advice on contraception: Secondary | ICD-10-CM | POA: Diagnosis not present

## 2018-02-10 DIAGNOSIS — Z566 Other physical and mental strain related to work: Secondary | ICD-10-CM | POA: Diagnosis not present

## 2018-02-10 DIAGNOSIS — R030 Elevated blood-pressure reading, without diagnosis of hypertension: Secondary | ICD-10-CM | POA: Diagnosis not present

## 2018-02-17 DIAGNOSIS — R0789 Other chest pain: Secondary | ICD-10-CM | POA: Diagnosis not present

## 2018-02-17 DIAGNOSIS — F419 Anxiety disorder, unspecified: Secondary | ICD-10-CM | POA: Diagnosis not present

## 2018-02-17 DIAGNOSIS — R030 Elevated blood-pressure reading, without diagnosis of hypertension: Secondary | ICD-10-CM | POA: Diagnosis not present

## 2018-03-02 DIAGNOSIS — Z3202 Encounter for pregnancy test, result negative: Secondary | ICD-10-CM | POA: Diagnosis not present

## 2018-03-02 DIAGNOSIS — Z30430 Encounter for insertion of intrauterine contraceptive device: Secondary | ICD-10-CM | POA: Diagnosis not present

## 2018-03-10 DIAGNOSIS — R079 Chest pain, unspecified: Secondary | ICD-10-CM | POA: Diagnosis not present

## 2018-03-10 DIAGNOSIS — R0789 Other chest pain: Secondary | ICD-10-CM | POA: Diagnosis not present

## 2018-03-10 DIAGNOSIS — R030 Elevated blood-pressure reading, without diagnosis of hypertension: Secondary | ICD-10-CM | POA: Diagnosis not present

## 2018-03-10 DIAGNOSIS — E669 Obesity, unspecified: Secondary | ICD-10-CM | POA: Diagnosis not present

## 2018-03-14 DIAGNOSIS — R079 Chest pain, unspecified: Secondary | ICD-10-CM | POA: Diagnosis not present

## 2018-04-15 DIAGNOSIS — Z30431 Encounter for routine checking of intrauterine contraceptive device: Secondary | ICD-10-CM | POA: Diagnosis not present

## 2018-04-15 DIAGNOSIS — N939 Abnormal uterine and vaginal bleeding, unspecified: Secondary | ICD-10-CM | POA: Diagnosis not present

## 2018-07-22 DIAGNOSIS — F419 Anxiety disorder, unspecified: Secondary | ICD-10-CM | POA: Diagnosis not present

## 2018-07-22 DIAGNOSIS — R232 Flushing: Secondary | ICD-10-CM | POA: Diagnosis not present

## 2018-07-22 DIAGNOSIS — G479 Sleep disorder, unspecified: Secondary | ICD-10-CM | POA: Diagnosis not present

## 2018-07-22 DIAGNOSIS — Z0000 Encounter for general adult medical examination without abnormal findings: Secondary | ICD-10-CM | POA: Diagnosis not present

## 2018-10-16 DIAGNOSIS — M25572 Pain in left ankle and joints of left foot: Secondary | ICD-10-CM | POA: Diagnosis not present

## 2018-10-21 DIAGNOSIS — R05 Cough: Secondary | ICD-10-CM | POA: Diagnosis not present

## 2018-10-21 DIAGNOSIS — J069 Acute upper respiratory infection, unspecified: Secondary | ICD-10-CM | POA: Diagnosis not present

## 2018-10-24 DIAGNOSIS — S93602A Unspecified sprain of left foot, initial encounter: Secondary | ICD-10-CM | POA: Diagnosis not present

## 2018-10-24 DIAGNOSIS — M25562 Pain in left knee: Secondary | ICD-10-CM | POA: Diagnosis not present

## 2019-02-21 DIAGNOSIS — R42 Dizziness and giddiness: Secondary | ICD-10-CM | POA: Diagnosis not present

## 2019-02-21 DIAGNOSIS — G4700 Insomnia, unspecified: Secondary | ICD-10-CM | POA: Diagnosis not present

## 2019-02-21 DIAGNOSIS — I10 Essential (primary) hypertension: Secondary | ICD-10-CM | POA: Diagnosis not present

## 2019-02-21 DIAGNOSIS — F419 Anxiety disorder, unspecified: Secondary | ICD-10-CM | POA: Diagnosis not present

## 2019-03-21 DIAGNOSIS — I10 Essential (primary) hypertension: Secondary | ICD-10-CM | POA: Diagnosis not present

## 2019-03-21 DIAGNOSIS — G4700 Insomnia, unspecified: Secondary | ICD-10-CM | POA: Diagnosis not present

## 2019-03-21 DIAGNOSIS — F419 Anxiety disorder, unspecified: Secondary | ICD-10-CM | POA: Diagnosis not present
